# Patient Record
Sex: Female | Born: 1975 | Race: White | Hispanic: No | Marital: Married | Smoking: Never smoker
Health system: Southern US, Community
[De-identification: ages and names within clinical notes are randomized; demographics above are authoritative.]

## PROBLEM LIST (undated history)

## (undated) HISTORY — PX: ABDOMINAL HYSTERECTOMY: SHX81

---

## 2019-10-29 ENCOUNTER — Other Ambulatory Visit: Payer: Self-pay

## 2019-10-29 ENCOUNTER — Encounter: Payer: Self-pay | Admitting: Emergency Medicine

## 2019-10-29 ENCOUNTER — Ambulatory Visit
Admission: EM | Admit: 2019-10-29 | Discharge: 2019-10-29 | Disposition: A | Discharge status: Home / Self Care | Payer: Federal, State, Local not specified - PPO | Attending: Family Medicine | Admitting: Family Medicine

## 2019-10-29 DIAGNOSIS — L255 Unspecified contact dermatitis due to plants, except food: Secondary | ICD-10-CM | POA: Diagnosis not present

## 2019-10-29 MED ORDER — PREDNISONE 10 MG PO TABS: ORAL_TABLET | ORAL | 0 refills | Status: DC

## 2019-10-29 NOTE — ED Triage Notes (Signed)
Pt c/o rash around her right eye and forehead and her stomach. Started yesterday. Rash is red,raised and itchy. She states that she burned brush over the weekend and concerned for poison oak/ivy.

## 2019-10-29 NOTE — ED Provider Notes (Signed)
[facility]    CSN: [id number] Arrival date & time: 10/29/19  1040      History   Chief Complaint Chief Complaint  Patient presents with  . Rash    HPI Aracelis Ulrey is a 43 y.o. female.   43 yo female with a c/o itchy rash to her face, forehead and abdomen that erupted after burning brush over the weekend. Patient states she thinks there may have been poison oak/ivy. Denies any fevers, chills or other symptoms.    Rash   History reviewed. No pertinent past medical history.  There are no problems to display for this patient.   Past Surgical History:  Procedure Laterality Date  . ABDOMINAL HYSTERECTOMY      OB History   No obstetric history on file.      Home Medications    Prior to Admission medications   Medication Sig Start Date End Date Taking? Authorizing Provider  estradiol (VIVELLE-DOT) 0.075 MG/24HR Vivelle-Dot 0.075 mg/24 hr transdermal patch 06/17/18  Yes [provider]  predniSONE (DELTASONE) 10 MG tablet Start 60 mg po day one, then 50 mg po day two, taper by 10 mg daily until complete. 10/29/19   Norval Gable, MD    Family History Family History  Problem Relation Age of Onset  . Healthy Mother   . Healthy Father     Social History Social History   Tobacco Use  . Smoking status: Never Smoker  . Smokeless tobacco: Never Used  Substance Use Topics  . Alcohol use: Not Currently  . Drug use: Not Currently     Allergies   Codeine and Morphine   Review of Systems Review of Systems  Skin: Positive for rash.     Physical Exam Triage Vital Signs ED Triage Vitals  Enc Vitals Group     BP 10/29/19 1108 113/88     Pulse Rate 10/29/19 1108 72     Resp 10/29/19 1108 18     Temp 10/29/19 1108 98.1 F (36.7 C)     Temp Source 10/29/19 1108 Oral     SpO2 10/29/19 1108 100 %     Weight 10/29/19 1105 215 lb (97.5 kg)     Height 10/29/19 1105 5\' 7"  (1.702 m)     Head Circumference --      Peak Flow --     Pain Score 10/29/19 1105 0     Pain Loc --      Pain Edu? --      Excl. in [place]? --    No data found.  Updated Vital Signs BP 113/88 (BP Location: Left Arm)   Pulse 72   Temp 98.1 F (36.7 C) (Oral)   Resp 18   Ht 5\' 7"  (1.702 m)   Wt 97.5 kg   SpO2 100%   BMI 33.67 kg/m   Visual Acuity Right Eye Distance:   Left Eye Distance:   Bilateral Distance:    Right Eye Near:   Left Eye Near:    Bilateral Near:     Physical Exam Vitals and nursing note reviewed.  Constitutional:      General: She is not in acute distress.    Appearance: She is not toxic-appearing or diaphoretic.  Skin:    Findings: Rash present. Rash is papular and vesicular.  Neurological:     Mental Status: She is alert.      UC Treatments / Results  Labs (all labs ordered are listed, but only abnormal results are displayed)  Labs Reviewed - No data to display  EKG   Radiology No results found.  Procedures Procedures (including critical care time)  Medications Ordered in UC Medications - No data to display  Initial Impression / Assessment and Plan / UC Course  I have reviewed the triage vital signs and the nursing notes.  Pertinent labs & imaging results that were available during my care of the patient were reviewed by me and considered in my medical decision making (see chart for details).     Final Clinical Impressions(s) / UC Diagnoses   Final diagnoses:  Contact dermatitis due to plant    ED Prescriptions    Medication Sig Dispense Auth. Provider   predniSONE (DELTASONE) 10 MG tablet Start 60 mg po day one, then 50 mg po day two, taper by 10 mg daily until complete. 21 tablet Payton Mccallum, MD      1. diagnosis reviewed with patient 2. rx as per orders above; reviewed possible side effects, interactions, risks and benefits  3. Recommend supportive treatment with otc anti-histamines prn 4. Follow-up prn if symptoms worsen or don't improve   PDMP not reviewed this  encounter.   Payton Mccallum, MD 10/29/19 [phone]

## 2022-02-07 ENCOUNTER — Ambulatory Visit (INDEPENDENT_AMBULATORY_CARE_PROVIDER_SITE_OTHER): Payer: BC Managed Care – PPO

## 2022-02-07 ENCOUNTER — Ambulatory Visit
Admission: EM | Admit: 2022-02-07 | Discharge: 2022-02-07 | Disposition: A | Discharge status: Home / Self Care | Payer: BC Managed Care – PPO | Attending: Physician Assistant | Admitting: Physician Assistant

## 2022-02-07 DIAGNOSIS — M79671 Pain in right foot: Secondary | ICD-10-CM

## 2022-02-07 DIAGNOSIS — R2241 Localized swelling, mass and lump, right lower limb: Secondary | ICD-10-CM

## 2022-02-07 NOTE — Discharge Instructions (Addendum)
FOOT PAIN: X-rays do not show any fractures.  Stressed avoiding painful activities . Reviewed RICE guidelines. Use medications as directed, including NSAIDs. If no NSAIDs have been prescribed for you today, you may take Aleve or Motrin over the counter. May use Tylenol in between doses of NSAIDs.  Also use Ace wrap.  If no improvement in the next 1-2 weeks, f/u with PCP or return to our office for reexamination, and please feel free to call or return at any time for any questions or concerns you may have and we will be happy to help you!     ?

## 2022-02-07 NOTE — ED Provider Notes (Signed)
?[facility] ? ? ? ?CSN: [id number] ?Arrival date & time: 02/07/22  0841 ? ? ?  ? ?History   ?Chief Complaint ?Chief Complaint  ?Patient presents with  ? Foot Pain  ? ? ?HPI ?Regina Jacobs is a 46 y.o. female presenting for right dorsal foot pain.  Patient says she excellently kicked her 40 pound dog yesterday.  She has swelling of the dorsal foot.  Admits to increased pain when she tries to stand up and walk.  She has taken NSAIDs over-the-counter.  She denies any associated numbness, weakness or tingling.  Does not report any previous history of fractures to this foot.  No other complaints. ? ?HPI ? ?History reviewed. No pertinent past medical history. ? ?There are no problems to display for this patient. ? ? ?Past Surgical History:  ?Procedure Laterality Date  ? ABDOMINAL HYSTERECTOMY    ? ? ?OB History   ?No obstetric history on file. ?  ? ? ? ?Home Medications   ? ?Prior to Admission medications   ?Medication Sig Start Date End Date Taking? Authorizing Provider  ?estradiol (VIVELLE-DOT) 0.075 MG/24HR Vivelle-Dot 0.075 mg/24 hr transdermal patch 06/17/18  Yes [provider]  ?predniSONE (DELTASONE) 10 MG tablet Start 60 mg po day one, then 50 mg po day two, taper by 10 mg daily until complete. 10/29/19  Yes Payton Mccallumonty, [place], MD  ? ? ?Family History ?Family History  ?Problem Relation Age of Onset  ? Healthy Mother   ? Healthy Father   ? ? ?Social History ?Social History  ? ?Tobacco Use  ? Smoking status: Never  ? Smokeless tobacco: Never  ?Vaping Use  ? Vaping Use: Never used  ?Substance Use Topics  ? Alcohol use: Yes  ? Drug use: Not Currently  ? ? ? ?Allergies   ?Codeine and Morphine ? ? ?Review of Systems ?Review of Systems  ?Musculoskeletal:  Positive for arthralgias and joint swelling. Negative for gait problem.  ?Skin:  Negative for color change and wound.  ?Neurological:  Negative for weakness and numbness.  ? ? ?Physical Exam ?Triage Vital Signs ?ED Triage Vitals  ?Enc Vitals Group   ?   BP 02/07/22 0903 (!) 128/97  ?   Pulse Rate 02/07/22 0903 68  ?   Resp 02/07/22 0903 18  ?   Temp 02/07/22 0903 97.8 ?F (36.6 ?C)  ?   Temp Source 02/07/22 0903 Oral  ?   SpO2 02/07/22 0903 100 %  ?   Weight 02/07/22 0901 230 lb (104.3 kg)  ?   Height 02/07/22 0901 5\' 7"  (1.702 m)  ?   Head Circumference --   ?   Peak Flow --   ?   Pain Score 02/07/22 0900 7  ?   Pain Loc --   ?   Pain Edu? --   ?   Excl. in [place]? --   ? ?No data found. ? ?Updated Vital Signs ?BP (!) 128/97 (BP Location: Left Arm)   Pulse 68   Temp 97.8 ?F (36.6 ?C) (Oral)   Resp 18   Ht 5\' 7"  (1.702 m)   Wt 230 lb (104.3 kg)   SpO2 100%   BMI 36.02 kg/m?  ?   ? ?Physical Exam ?Vitals and nursing note reviewed.  ?Constitutional:   ?   General: She is not in acute distress. ?   Appearance: Normal appearance. She is not ill-appearing or toxic-appearing.  ?HENT:  ?   Head: Normocephalic and atraumatic.  ?Eyes:  ?  General: No scleral icterus.    ?   Right eye: No discharge.     ?   Left eye: No discharge.  ?   Conjunctiva/sclera: Conjunctivae normal.  ?Cardiovascular:  ?   Rate and Rhythm: Normal rate.  ?   Pulses: Normal pulses.  ?Pulmonary:  ?   Effort: Pulmonary effort is normal. No respiratory distress.  ?Musculoskeletal:  ?   Cervical back: Neck supple.  ?   Right foot: Normal range of motion and normal capillary refill. Swelling (mild swelling dorsal mid foot) and tenderness (diffuse TTP throughout all metatarsals especially second, third and fouth) present. Normal pulse.  ?Skin: ?   General: Skin is dry.  ?Neurological:  ?   General: No focal deficit present.  ?   Mental Status: She is alert. Mental status is at baseline.  ?   Motor: No weakness.  ?   Gait: Gait abnormal.  ?Psychiatric:     ?   Mood and Affect: Mood normal.     ?   Behavior: Behavior normal.     ?   Thought Content: Thought content normal.  ? ? ? ?UC Treatments / Results  ?Labs ?(all labs ordered are listed, but only abnormal results are displayed) ?Labs Reviewed - No  data to display ? ?EKG ? ? ?Radiology ?DG Foot Complete Right ? ?Result Date: 02/07/2022 ?CLINICAL DATA:  Pain top of foot EXAM: RIGHT FOOT COMPLETE - 3 VIEW COMPARISON:  None. FINDINGS: There is no evidence of fracture or dislocation. There is no evidence of arthropathy or other focal bone abnormality. Soft tissue swelling the dorsal midfoot. IMPRESSION: No acute osseous abnormality. Electronically Signed   By: Allegra Lai M.D.   On: 02/07/2022 09:29   ? ?Procedures ?Procedures (including critical care time) ? ?Medications Ordered in UC ?Medications - No data to display ? ?Initial Impression / Assessment and Plan / UC Course  ?I have reviewed the triage vital signs and the nursing notes. ? ?Pertinent labs & imaging results that were available during my care of the patient were reviewed by me and considered in my medical decision making (see chart for details). ? ?  ?46 year old female presenting for right foot pain and swelling following an accidental injury that occurred yesterday when she kicked her 40 pound dog.  Patient has taken NSAIDs. ? ?X-ray obtained of the foot today shows swelling of the dorsal midfoot.  No evidence of fracture.  I discussed this with her.  Advised of RICE guidelines.  Offered an Ace wrap but she declines.  Advised to take ibuprofen and Tylenol for pain and follow-up as needed. ? ? ?Final Clinical Impressions(s) / UC Diagnoses  ? ?Final diagnoses:  ?Foot pain, right  ?Localized swelling of right foot  ? ? ? ?Discharge Instructions   ? ?  ?FOOT PAIN: X-rays do not show any fractures.  Stressed avoiding painful activities . Reviewed RICE guidelines. Use medications as directed, including NSAIDs. If no NSAIDs have been prescribed for you today, you may take Aleve or Motrin over the counter. May use Tylenol in between doses of NSAIDs.  Also use Ace wrap.  If no improvement in the next 1-2 weeks, f/u with PCP or return to our office for reexamination, and please feel free to call or  return at any time for any questions or concerns you may have and we will be happy to help you!     ? ? ? ? ?ED Prescriptions   ?None ?  ? ?  PDMP not reviewed this encounter. ?  ?Shirlee Latch, PA-C ?02/07/22 3903 ? ?

## 2022-02-07 NOTE — ED Triage Notes (Signed)
Pt c/o right foot pain. Pt kicked her dog by accident and hurt the top of her foot.  ? ?Pt has no pain along the side of her foot, ankle, or toes. Pain is along the top of the foot when flexed or when pressure is applied while walking.  ?

## 2022-05-24 ENCOUNTER — Ambulatory Visit
Admission: EM | Admit: 2022-05-24 | Discharge: 2022-05-24 | Disposition: A | Discharge status: Home / Self Care | Payer: BC Managed Care – PPO | Attending: Emergency Medicine | Admitting: Emergency Medicine

## 2022-05-24 ENCOUNTER — Encounter: Payer: Self-pay | Admitting: Emergency Medicine

## 2022-05-24 ENCOUNTER — Other Ambulatory Visit: Payer: Self-pay

## 2022-05-24 DIAGNOSIS — B029 Zoster without complications: Secondary | ICD-10-CM | POA: Diagnosis not present

## 2022-05-24 MED ORDER — IBUPROFEN 600 MG PO TABS: 600.0000 mg | ORAL_TABLET | Freq: Four times a day (QID) | ORAL | 0 refills | Status: AC | PRN

## 2022-05-24 MED ORDER — HYDROCODONE-ACETAMINOPHEN 5-325 MG PO TABS: 1.0000 | ORAL_TABLET | Freq: Four times a day (QID) | ORAL | 0 refills | Status: AC | PRN

## 2022-05-24 MED ORDER — VALACYCLOVIR HCL 1 G PO TABS: 1000.0000 mg | ORAL_TABLET | Freq: Three times a day (TID) | ORAL | 0 refills | Status: AC

## 2022-05-24 NOTE — ED Provider Notes (Signed)
HPI  SUBJECTIVE:  Regina Jacobs is a 46 y.o. female who presents with 5 days of constant, achy right trapezius/shoulder pain that has traveled down her shoulder and forearm.  She reports sharp shooting pain in her hand.  She reports 2 days of an erythematous, tender rash in the area of pain.  It does not itch.  She saw orthopedics, had an x-ray of her shoulder, and was started on a prednisone taper.  Her first dose was yesterday.  No fevers, body aches, known exposure to poison ivy, poison oak.  The prednisone helped with the shoulder pain.  She has also been taking Tylenol and Benadryl at night.  Symptoms are worse with palpation, rubbing the rash against clothing.  She has a past medical history of varicella.  LMP: Status post hysterectomy.  PCP: [facility].   History reviewed. No pertinent past medical history.  Past Surgical History:  Procedure Laterality Date   ABDOMINAL HYSTERECTOMY      Family History  Problem Relation Age of Onset   Healthy Mother    Healthy Father     Social History   Tobacco Use   Smoking status: Never   Smokeless tobacco: Never  Vaping Use   Vaping Use: Never used  Substance Use Topics   Alcohol use: Yes   Drug use: Not Currently    No current facility-administered medications for this encounter.  Current Outpatient Medications:    estradiol (VIVELLE-DOT) 0.075 MG/24HR, Vivelle-Dot 0.075 mg/24 hr transdermal patch, Disp: , Rfl:    HYDROcodone-acetaminophen (NORCO/VICODIN) 5-325 MG tablet, Take 1-2 tablets by mouth every 6 (six) hours as needed for moderate pain., Disp: 12 tablet, Rfl: 0   ibuprofen (ADVIL) 600 MG tablet, Take 1 tablet (600 mg total) by mouth every 6 (six) hours as needed., Disp: 20 tablet, Rfl: 0   predniSONE (DELTASONE) 10 MG tablet, Start 60 mg po day one, then 50 mg po day two, taper by 10 mg daily until complete., Disp: 21 tablet, Rfl: 0   valACYclovir (VALTREX) 1000 MG tablet, Take 1 tablet (1,000 mg total) by  mouth 3 (three) times daily for 7 days., Disp: 21 tablet, Rfl: 0  Allergies  Allergen Reactions   Codeine Nausea Only    Severe nausea    Morphine     Other reaction(s): Headache     ROS  As noted in HPI.   Physical Exam  BP (!) 121/96 (BP Location: Left Arm)   Pulse 76   Temp 98.2 F (36.8 C) (Oral)   Resp 16   Ht 5\' 7"  (1.702 m)   Wt 104.3 kg   SpO2 97%   BMI 36.01 kg/m   Constitutional: Well developed, well nourished, no acute distress Eyes:  EOMI, conjunctiva normal bilaterally HENT: Normocephalic, atraumatic,mucus membranes moist Respiratory: Normal inspiratory effort Cardiovascular: Normal rate GI: nondistended skin: Tender erythematous, vesicular rash in the right C5 dermatomal distribution over the right  bicep, forearm      Musculoskeletal: Mild tenderness in the mid right trapezius. Neurologic: Alert & oriented x 3, no focal neuro deficits Psychiatric: Speech and behavior appropriate   ED Course   Medications - No data to display  No orders of the defined types were placed in this encounter.   No results found for this or any previous visit (from the past 24 hour(s)). No results found.  ED Clinical Impression  1. Herpes zoster without complication      ED Assessment/Plan  Unable to review [place] [facility]arc[facility] database for  this patient-does not come up despite multiple attempts.  Crashes epic. feel that the risk/benefit ratio today is favorable for proceeding with a prescription for controlled substance.   Presentation concerning for shingles with a painful rash in a dermatomal distribution.  We will have her discontinue prednisone, as it could disseminate the rash.we will send home with Valtrex, ibuprofen/Tylenol containing product 3-4 times a day.  Either ibuprofen/Tylenol for mild to moderate pain or ibuprofen/Norco for severe pain.  Bactroban to help prevent infection.  Follow-up with PCP as needed.  Discussed  MDM, treatment plan, and plan for  follow-up with patient. Discussed sn/sx that should prompt return to the ED. patient agrees with plan.   Meds ordered this encounter  Medications   HYDROcodone-acetaminophen (NORCO/VICODIN) 5-325 MG tablet    Sig: Take 1-2 tablets by mouth every 6 (six) hours as needed for moderate pain.    Dispense:  12 tablet    Refill:  0   ibuprofen (ADVIL) 600 MG tablet    Sig: Take 1 tablet (600 mg total) by mouth every 6 (six) hours as needed.    Dispense:  20 tablet    Refill:  0   valACYclovir (VALTREX) 1000 MG tablet    Sig: Take 1 tablet (1,000 mg total) by mouth 3 (three) times daily for 7 days.    Dispense:  21 tablet    Refill:  0      *This clinic note was created using Scientist, clinical (histocompatibility and immunogenetics). Therefore, there may be occasional mistakes despite careful proofreading.  ?    Domenick Gong, MD 05/24/22 [phone]

## 2022-05-24 NOTE — ED Triage Notes (Signed)
Pt c/o rash on her right upper and lower arm. Started about 2 days ago. She states she has pain in the arm and was seen by ortho and given prednisone. Started taking it yesterday.

## 2022-05-24 NOTE — Discharge Instructions (Addendum)
I am concerned that this could be shingles.  Stop the prednisone.  Take 600 mg of ibuprofen with a Tylenol containing product 3-4 times a day as needed for pain.  Either ibuprofen with 1000 mg of Tylenol or ibuprofen with 1-2 Norco for severe pain.  Finish the Valtrex, even if you feel better.  This will reduce the intensity and duration of the outbreak.  Bactroban to help prevent infection when it starts to crust over.

## 2023-08-09 IMAGING — CR DG FOOT COMPLETE 3+V*R*
3 series · 3 of 3 positions shown · non-contrast
Comparison: None.

CLINICAL DATA: Pain top of foot

EXAM:
RIGHT FOOT COMPLETE - 3 VIEW

[foot ap]
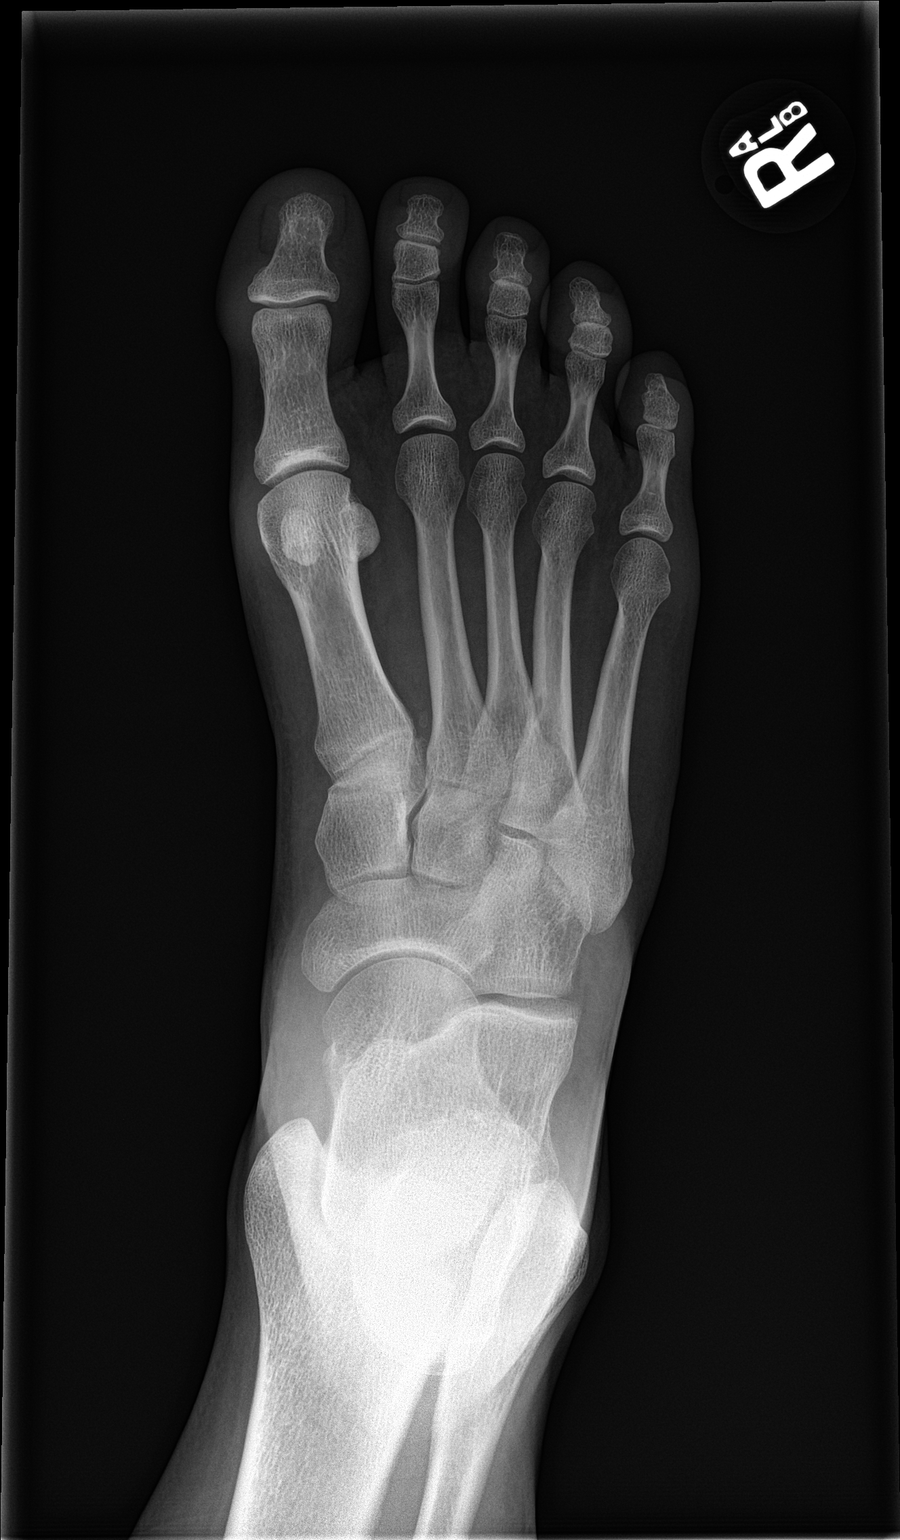

[foot obl]
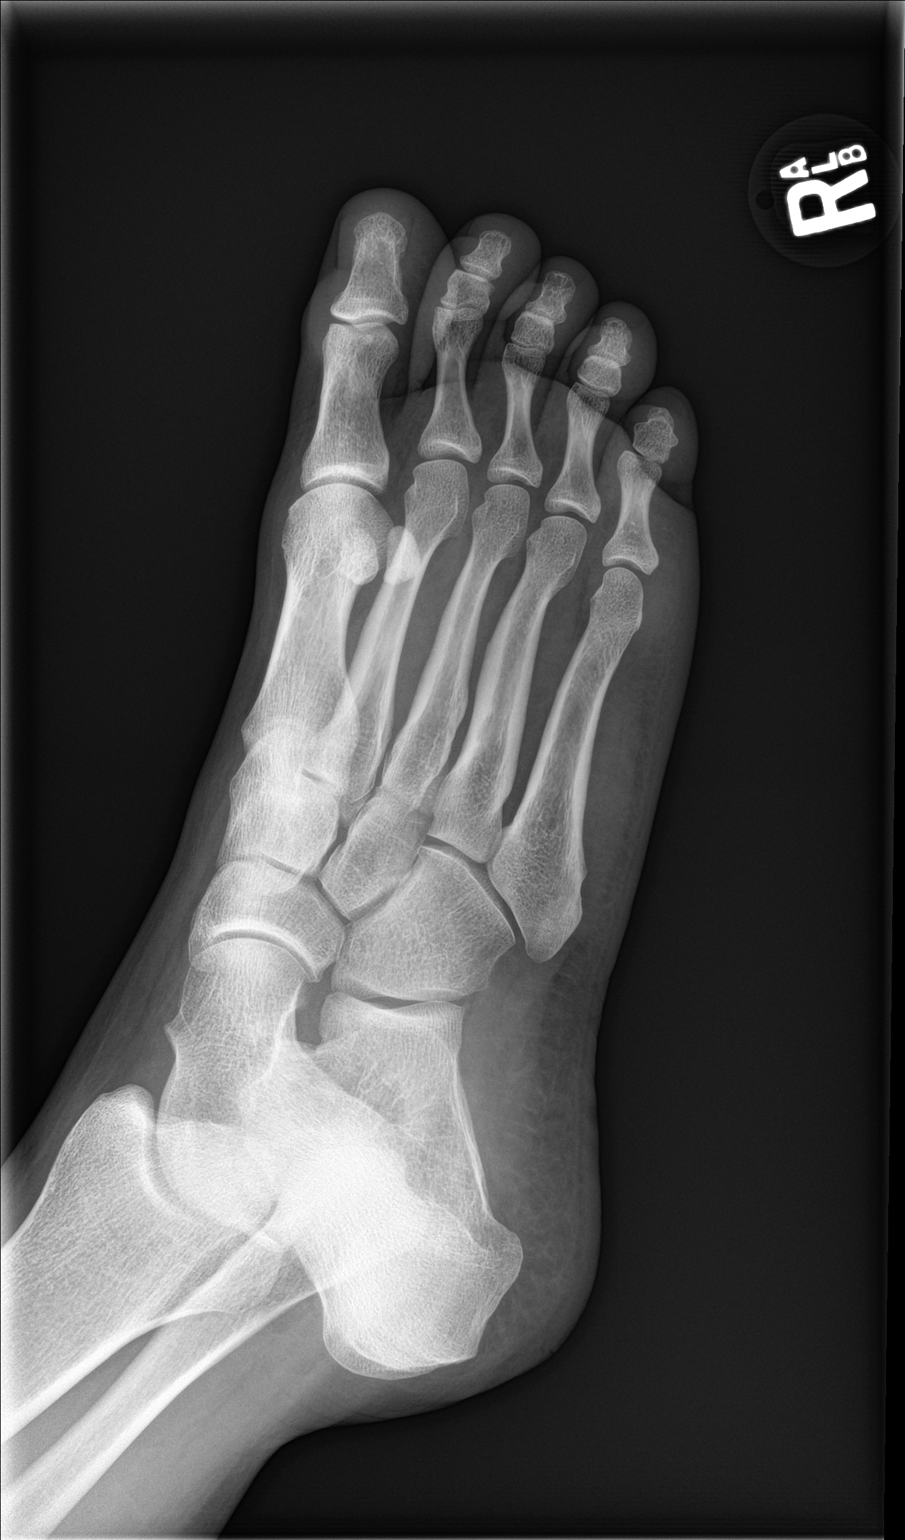

[foot lat]
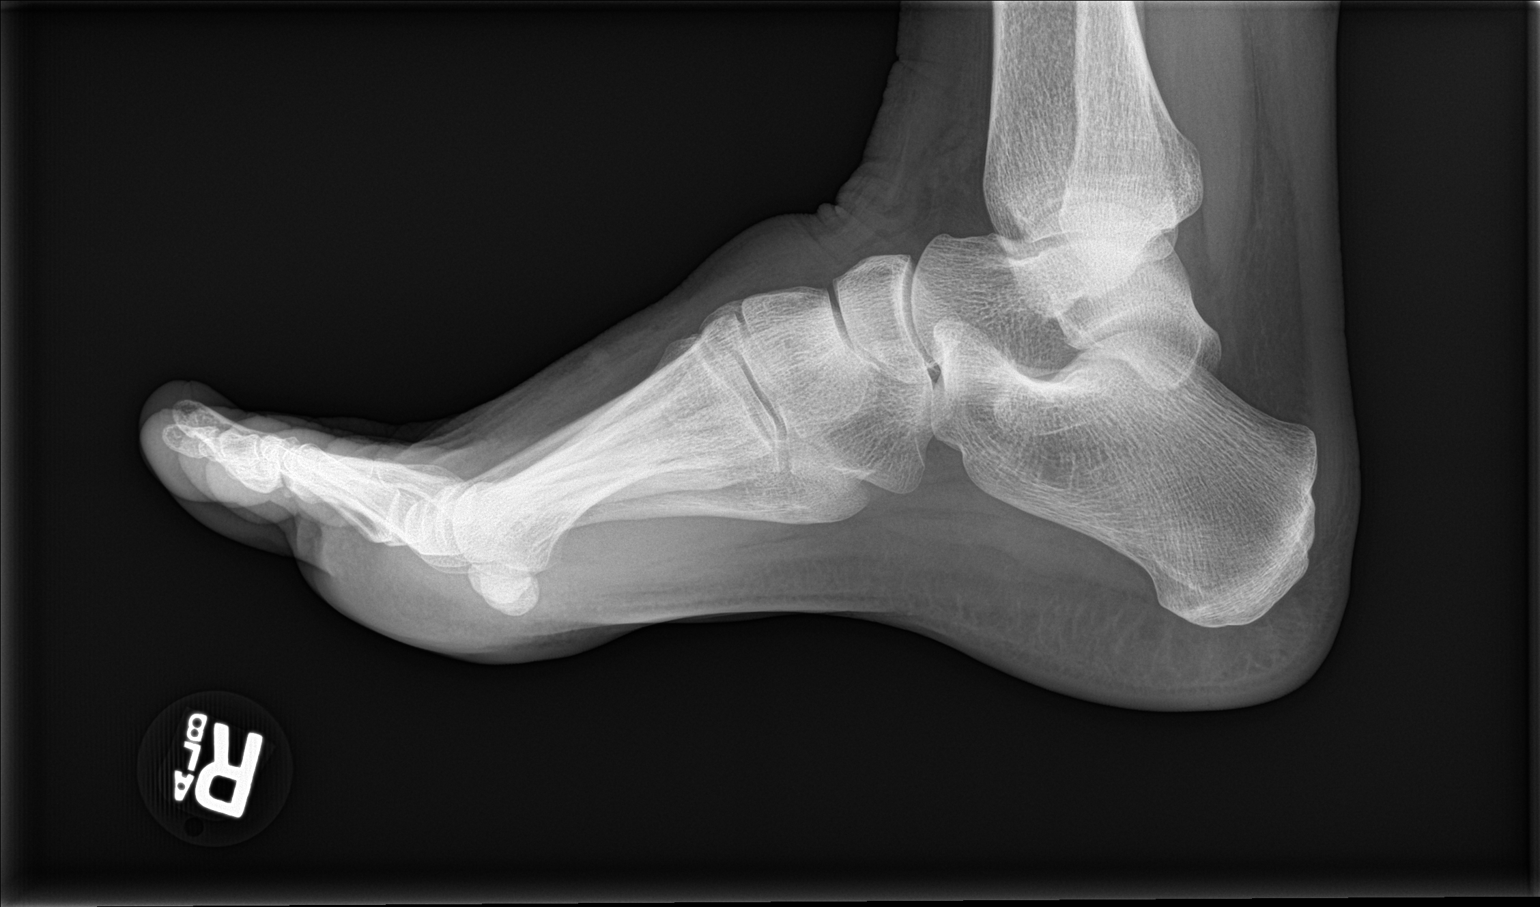

[3 of 3 positions shown; findings below may reference images not displayed]

FINDINGS: There is no evidence of fracture or dislocation. There is no
evidence of arthropathy or other focal bone abnormality. Soft tissue
swelling the dorsal midfoot.
IMPRESSION: No acute osseous abnormality.

## 2023-12-08 ENCOUNTER — Ambulatory Visit
Admission: RE | Admit: 2023-12-08 | Discharge: 2023-12-08 | Disposition: A | Discharge status: Home / Self Care | Payer: Self-pay | Source: Ambulatory Visit | Attending: Family Medicine | Admitting: Family Medicine

## 2023-12-08 VITALS — BP 129/101 | HR 70 | Temp 98.6°F | Resp 16

## 2023-12-08 DIAGNOSIS — L241 Irritant contact dermatitis due to oils and greases: Secondary | ICD-10-CM

## 2023-12-08 MED ORDER — PREDNISONE 10 MG (21) PO TBPK: ORAL_TABLET | Freq: Every day | ORAL | 0 refills | Status: AC

## 2023-12-08 MED ORDER — DEXAMETHASONE SODIUM PHOSPHATE 10 MG/ML IJ SOLN
10.0000 mg | Freq: Once | INTRAMUSCULAR | Status: AC
  Administered 2023-12-08: 10 mg via INTRAMUSCULAR

## 2023-12-08 NOTE — ED Triage Notes (Signed)
 Patient has poison ivy, she was given cream through evist. Rash has spread to stomach and breast. Its all over body.

## 2023-12-08 NOTE — Discharge Instructions (Addendum)
 Stop by the pharmacy to pick up your prescriptions.  Follow up with your primary care provider or return to the urgent care.   Pepcid, Zrytec or Claritin can help with itching as well.  Use Benadyl as needed to help with sleep.

## 2023-12-08 NOTE — ED Provider Notes (Signed)
 [facility]    CSN: [id number] Arrival date & time: 12/08/23  1106      History   Chief Complaint Chief Complaint  Patient presents with   Poison Ivy    Still spreading even with meds from previous doctor through an evisit - Entered by patient    HPI Regina Jacobs is a 48 y.o. female.   HPI  Regina Jacobs presents for rash concerning for poison ivy. They were cutting down trees a week ago and the rash came ion 2 days later.  The rash started on her ankles and wrist and spread to her abdomen and breasts.   She did an E-visit and was given some ointment but no oral steroids. She was told to go into the office if the rash spreads.      History reviewed. No pertinent past medical history.  There are no active problems to display for this patient.   Past Surgical History:  Procedure Laterality Date   ABDOMINAL HYSTERECTOMY      OB History   No obstetric history on file.      Home Medications    Prior to Admission medications   Medication Sig Start Date End Date Taking? Authorizing Provider  predniSONE  (STERAPRED UNI-PAK 21 TAB) 10 MG (21) TBPK tablet Take by mouth daily. Take 6 tabs by mouth daily for 1, then 5 tabs for 1 day, then 4 tabs for 1 day, then 3 tabs for 1 day, then 2 tabs for 1 day, then 1 tab for 1 day. 12/08/23  Yes Keane Martelli, DO  estradiol (VIVELLE-DOT) 0.075 MG/24HR Vivelle-Dot 0.075 mg/24 hr transdermal patch 06/17/18  Yes [provider]  HYDROcodone -acetaminophen  (NORCO/VICODIN) 5-325 MG tablet Take 1-2 tablets by mouth every 6 (six) hours as needed for moderate pain. 05/24/22   Van Knee, MD  ibuprofen  (ADVIL ) 600 MG tablet Take 1 tablet (600 mg total) by mouth every 6 (six) hours as needed. 05/24/22   Van Knee, MD  triamcinolone cream (KENALOG) 0.1 % APPLY TWICE DAILY FOR 10 DAYS AS NEEDED FOR REDNESS AND ITCHING    [provider]    Family History Family History  Problem Relation Age of Onset    Healthy Mother    Healthy Father     Social History Social History   Tobacco Use   Smoking status: Never   Smokeless tobacco: Never  Vaping Use   Vaping status: Never Used  Substance Use Topics   Alcohol use: Yes   Drug use: Not Currently     Allergies   Codeine and Morphine   Review of Systems Review of Systems :negative unless otherwise stated in HPI.      Physical Exam Triage Vital Signs ED Triage Vitals  Encounter Vitals Group     BP 12/08/23 1143 (!) 129/101     Systolic BP Percentile --      Diastolic BP Percentile --      Pulse Rate 12/08/23 1143 70     Resp 12/08/23 1143 16     Temp 12/08/23 1143 98.6 F (37 C)     Temp Source 12/08/23 1143 Oral     SpO2 12/08/23 1143 96 %     Weight --      Height --      Head Circumference --      Peak Flow --      Pain Score 12/08/23 1141 4     Pain Loc --      Pain Education --  Exclude from Growth Chart --    No data found.  Updated Vital Signs BP (!) 129/101 (BP Location: Left Arm)   Pulse 70   Temp 98.6 F (37 C) (Oral)   Resp 16   SpO2 96%   Visual Acuity Right Eye Distance:   Left Eye Distance:   Bilateral Distance:    Right Eye Near:   Left Eye Near:    Bilateral Near:     Physical Exam  GEN: alert, well appearing female, in no acute distress  EYES: no scleral injection RESP: no increased work of breathing MSK: no extremity edema, no gross deformities NEURO: alert, moves all extremities appropriately SKIN: warm and dry; erythematous blisters and patches on bilateral upper and lower extremities and trunk, no lesions on the palms  UC Treatments / Results  Labs (all labs ordered are listed, but only abnormal results are displayed) Labs Reviewed - No data to display  EKG   Radiology No results found.  Procedures Procedures (including critical care time)  Medications Ordered in UC Medications  dexamethasone  (DECADRON ) injection 10 mg (10 mg Intramuscular Given 12/08/23 1234)     Initial Impression / Assessment and Plan / UC Course  I have reviewed the triage vital signs and the nursing notes.  Pertinent labs & imaging results that were available during my care of the patient were reviewed by me and considered in my medical decision making (see chart for details).     Patient is a 48 y.o. femalewho presents for rash.  Overall, patient is well-appearing and well-hydrated.  Vital signs stable.  Donta is afebrile.  Exam concerning for contact dermatitis from poisonous plants.  Treat with Decadron  IM 10 mg and prednisone  taper. Continue steroid ointment. No sign of infection to suggest antibiotics or antifungals  at this time.     Reviewed expectations regarding course of current medical issues.  All questions asked were answered.  Outlined signs and symptoms indicating need for more acute intervention. Patient verbalized understanding. After Visit Summary given.   Final Clinical Impressions(s) / UC Diagnoses   Final diagnoses:  Irritant contact dermatitis due to oils     Discharge Instructions      Stop by the pharmacy to pick up your prescriptions.  Follow up with your primary care provider or return to the urgent care.   Pepcid, Zrytec or Claritin can help with itching as well.  Use Benadyl as needed to help with sleep.      ED Prescriptions     Medication Sig Dispense Auth. Provider   predniSONE  (STERAPRED UNI-PAK 21 TAB) 10 MG (21) TBPK tablet Take by mouth daily. Take 6 tabs by mouth daily for 1, then 5 tabs for 1 day, then 4 tabs for 1 day, then 3 tabs for 1 day, then 2 tabs for 1 day, then 1 tab for 1 day. 21 tablet [name], DO      PDMP not reviewed this encounter.              [name], DO 12/08/23 1526
